# Patient Record
Sex: Male | Born: 1985
Health system: Southern US, Community
[De-identification: ages and names within clinical notes are randomized; demographics above are authoritative.]

---
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
## 2018-03-14 ENCOUNTER — Ambulatory Visit: Payer: Self-pay | Admitting: Nurse Practitioner

## 2018-03-14 ENCOUNTER — Encounter: Payer: Self-pay | Admitting: Nurse Practitioner

## 2018-03-14 VITALS — BP 122/82 | HR 76 | Temp 97.8°F | Ht 68.0 in | Wt 176.8 lb

## 2018-03-14 DIAGNOSIS — Z0000 Encounter for general adult medical examination without abnormal findings: Secondary | ICD-10-CM

## 2018-03-14 NOTE — Patient Instructions (Addendum)
Health Maintenance, Male [phone] or [phone] A healthy lifestyle and preventive care is important for your health and wellness. Ask your health care provider about what schedule of regular examinations is right for you. What should I know about weight and diet? Eat a Healthy Diet  Eat plenty of vegetables, fruits, whole grains, low-fat dairy products, and lean protein.  Do not eat a lot of foods high in solid fats, added sugars, or salt.  Maintain a Healthy Weight Regular exercise can help you achieve or maintain a healthy weight. You should:  Do at least 150 minutes of exercise each week. The exercise should increase your heart rate and make you sweat (moderate-intensity exercise).  Do strength-training exercises at least twice a week.  Watch Your Levels of Cholesterol and Blood Lipids  Have your blood tested for lipids and cholesterol every 5 years starting at 33 years of age. If you are at high risk for heart disease, you should start having your blood tested when you are 33 years old. You may need to have your cholesterol levels checked more often if: ? Your lipid or cholesterol levels are high. ? You are older than 33 years of age. ? You are at high risk for heart disease.  What should I know about cancer screening? Many types of cancers can be detected early and may often be prevented. Lung Cancer  You should be screened every year for lung cancer if: ? You are a current smoker who has smoked for at least 30 years. ? You are a former smoker who has quit within the past 15 years.  Talk to your health care provider about your screening options, when you should start screening, and how often you should be screened.  Colorectal Cancer  Routine colorectal cancer screening usually begins at 33 years of age and should be repeated every 5-10 years until you are 33 years old. You may need to be screened more often if early forms of precancerous  polyps or small growths are found. Your health care provider may recommend screening at an earlier age if you have risk factors for colon cancer.  Your health care provider may recommend using home test kits to check for hidden blood in the stool.  A small camera at the end of a tube can be used to examine your colon (sigmoidoscopy or colonoscopy). This checks for the earliest forms of colorectal cancer.  Prostate and Testicular Cancer  Depending on your age and overall health, your health care provider may do certain tests to screen for prostate and testicular cancer.  Talk to your health care provider about any symptoms or concerns you have about testicular or prostate cancer.  Skin Cancer  Check your skin from head to toe regularly.  Tell your health care provider about any new moles or changes in moles, especially if: ? There is a change in a mole's size, shape, or color. ? You have a mole that is larger than a pencil eraser.  Always use sunscreen. Apply sunscreen liberally and repeat throughout the day.  Protect yourself by wearing long sleeves, pants, a wide-brimmed hat, and sunglasses when outside.  What should I know about heart disease, diabetes, and high blood pressure?  If you are 48-52 years of age, have your blood pressure checked every 3-5 years. If you are 49 years of age or older, have your blood pressure checked every year. You should have your blood pressure measured twice-once when you are at a  hospital or clinic, and once when you are not at a hospital or clinic. Record the average of the two measurements. To check your blood pressure when you are not at a hospital or clinic, you can use: ? An automated blood pressure machine at a pharmacy. ? A home blood pressure monitor.  Talk to your health care provider about your target blood pressure.  If you are between 41-43 years old, ask your health care provider if you should take aspirin to prevent heart  disease.  Have regular diabetes screenings by checking your fasting blood sugar level. ? If you are at a normal weight and have a low risk for diabetes, have this test once every three years after the age of 41. ? If you are overweight and have a high risk for diabetes, consider being tested at a younger age or more often.  A one-time screening for abdominal aortic aneurysm (AAA) by ultrasound is recommended for men aged 30-75 years who are current or former smokers. What should I know about preventing infection? Hepatitis B If you have a higher risk for hepatitis B, you should be screened for this virus. Talk with your health care provider to find out if you are at risk for hepatitis B infection. Hepatitis C Blood testing is recommended for:  Everyone born from [birth year] through 1966.  Anyone with known risk factors for hepatitis C.  Sexually Transmitted Diseases (STDs)  You should be screened each year for STDs including gonorrhea and chlamydia if: ? You are sexually active and are younger than 33 years of age. ? You are older than 33 years of age and your health care provider tells you that you are at risk for this type of infection. ? Your sexual activity has changed since you were last screened and you are at an increased risk for chlamydia or gonorrhea. Ask your health care provider if you are at risk.  Talk with your health care provider about whether you are at high risk of being infected with HIV. Your health care provider may recommend a prescription medicine to help prevent HIV infection.  What else can I do?  Schedule regular health, dental, and eye exams.  Stay current with your vaccines (immunizations).  Do not use any tobacco products, such as cigarettes, chewing tobacco, and e-cigarettes. If you need help quitting, ask your health care provider.  Limit alcohol intake to no more than 2 drinks per day. One drink equals 12 ounces of beer, 5 ounces of wine, or 1 ounces of  hard liquor.  Do not use street drugs.  Do not share needles.  Ask your health care provider for help if you need support or information about quitting drugs.  Tell your health care provider if you often feel depressed.  Tell your health care provider if you have ever been abused or do not feel safe at home. This information is not intended to replace advice given to you by your health care provider. Make sure you discuss any questions you have with your health care provider. Document Released: 03/05/2008 Document Revised: 05/06/2016 Document Reviewed: 06/11/2015 Elsevier Interactive Patient Education  2018 [place] 18-39 Years, Male Preventive care refers to lifestyle choices and visits with your health care provider that can promote health and wellness. What does preventive care include?  A yearly physical exam. This is also called an annual well check.  Dental exams once or twice a year.  Routine eye exams. Ask your health care  provider how often you should have your eyes checked.  Personal lifestyle choices, including: ? Daily care of your teeth and gums. ? Regular physical activity. ? Eating a healthy diet. ? Avoiding tobacco and drug use. ? Limiting alcohol use. ? Practicing safe sex. What happens during an annual well check? The services and screenings done by your health care provider during your annual well check will depend on your age, overall health, lifestyle risk factors, and family history of disease. Counseling Your health care provider may ask you questions about your:  Alcohol use.  Tobacco use.  Drug use.  Emotional well-being.  Home and relationship well-being.  Sexual activity.  Eating habits.  Work and work Statistician.  Screening You may have the following tests or measurements:  Height, weight, and BMI.  Blood pressure.  Lipid and cholesterol levels. These may be checked every 5 years starting at age  42.  Diabetes screening. This is done by checking your blood sugar (glucose) after you have not eaten for a while (fasting).  Skin check.  Hepatitis C blood test.  Hepatitis B blood test.  Sexually transmitted disease (STD) testing.  Discuss your test results, treatment options, and if necessary, the need for more tests with your health care provider. Vaccines Your health care provider may recommend certain vaccines, such as:  Influenza vaccine. This is recommended every year.  Tetanus, diphtheria, and acellular pertussis (Tdap, Td) vaccine. You may need a Td booster every 10 years.  Varicella vaccine. You may need this if you have not been vaccinated.  HPV vaccine. If you are 67 or younger, you may need three doses over 6 months.  Measles, mumps, and rubella (MMR) vaccine. You may need at least one dose of MMR.You may also need a second dose.  Pneumococcal 13-valent conjugate (PCV13) vaccine. You may need this if you have certain conditions and have not been vaccinated.  Pneumococcal polysaccharide (PPSV23) vaccine. You may need one or two doses if you smoke cigarettes or if you have certain conditions.  Meningococcal vaccine. One dose is recommended if you are age 88-21 years and a first-year college student living in a residence hall, or if you have one of several medical conditions. You may also need additional booster doses.  Hepatitis A vaccine. You may need this if you have certain conditions or if you travel or work in places where you may be exposed to hepatitis A.  Hepatitis B vaccine. You may need this if you have certain conditions or if you travel or work in places where you may be exposed to hepatitis B.  Haemophilus influenzae type b (Hib) vaccine. You may need this if you have certain risk factors.  Talk to your health care provider about which screenings and vaccines you need and how often you need them. This information is not intended to replace advice given  to you by your health care provider. Make sure you discuss any questions you have with your health care provider. Document Released: 11/03/2001 Document Revised: 05/27/2016 Document Reviewed: 07/09/2015 Elsevier Interactive Patient Education  2018 [employer].  Preventing Hypertension Hypertension, commonly called high blood pressure, is when the force of blood pumping through the arteries is too strong. Arteries are blood vessels that carry blood from the heart throughout the body. Over time, hypertension can damage the arteries and decrease blood flow to important parts of the body, including the brain, heart, and kidneys. Often, hypertension does not cause symptoms until blood pressure is very high. For this  reason, it is important to have your blood pressure checked on a regular basis. Hypertension can often be prevented with diet and lifestyle changes. If you already have hypertension, you can control it with diet and lifestyle changes, as well as medicine. What nutrition changes can be made? Maintain a healthy diet. This includes:  Eating less salt (sodium). Ask your health care provider how much sodium is safe for you to have. The general recommendation is to consume less than 1 tsp (2,300 mg) of sodium a day. ? Do not add salt to your food. ? Choose low-sodium options when grocery shopping and eating out.  Limiting fats in your diet. You can do this by eating low-fat or fat-free dairy products and by eating less red meat.  Eating more fruits, vegetables, and whole grains. Make a goal to eat: ? 1-2 cups of fresh fruits and vegetables each day. ? 3-4 servings of whole grains each day.  Avoiding foods and beverages that have added sugars.  Eating fish that contain healthy fats (omega-3 fatty acids), such as mackerel or salmon.  If you need help putting together a healthy eating plan, try the DASH diet. This diet is high in fruits, vegetables, and whole grains. It is low in sodium, red  meat, and added sugars. DASH stands for Dietary Approaches to Stop Hypertension. What lifestyle changes can be made?  Lose weight if you are overweight. Losing just 3?5% of your body weight can help prevent or control hypertension. ? For example, if your present weight is 200 lb (91 kg), a loss of 3-5% of your weight means losing 6-10 lb (2.7-4.5 kg). ? Ask your health care provider to help you with a diet and exercise plan to safely lose weight.  Get enough exercise. Do at least 150 minutes of moderate-intensity exercise each week. ? You could do this in short exercise sessions several times a day, or you could do longer exercise sessions a few times a week. For example, you could take a brisk 10-minute walk or bike ride, 3 times a day, for 5 days a week.  Find ways to reduce stress, such as exercising, meditating, listening to music, or taking a yoga class. If you need help reducing stress, ask your health care provider.  Do not smoke. This includes e-cigarettes. Chemicals in tobacco and nicotine products raise your blood pressure each time you smoke. If you need help quitting, ask your health care provider.  Avoid alcohol. If you drink alcohol, limit alcohol intake to no more than 1 drink a day for nonpregnant women and 2 drinks a day for men. One drink equals 12 oz of beer, 5 oz of wine, or 1 oz of hard liquor. Why are these changes important? Diet and lifestyle changes can help you prevent hypertension, and they may make you feel better overall and improve your quality of life. If you have hypertension, making these changes will help you control it and help prevent major complications, such as:  Hardening and narrowing of arteries that supply blood to: ? Your heart. This can cause a heart attack. ? Your brain. This can cause a stroke. ? Your kidneys. This can cause kidney failure.  Stress on your heart muscle, which can cause heart failure.  What can I do to lower my risk?  Work with  your health care provider to make a hypertension prevention plan that works for you. Follow your plan and keep all follow-up visits as told by your health care provider.  Learn how to check your blood pressure at home. Make sure that you know your personal target blood pressure, as told by your health care provider. How is this treated? In addition to diet and lifestyle changes, your health care provider may recommend medicines to help lower your blood pressure. You may need to try a few different medicines to find what works best for you. You also may need to take more than one medicine. Take over-the-counter and prescription medicines only as told by your health care provider. Where to find support: Your health care provider can help you prevent hypertension and help you keep your blood pressure at a healthy level. Your local hospital or your community may also provide support services and prevention programs. The American Heart Association offers an online support network at: CheapBootlegs.com.cy Where to find more information: Learn more about hypertension from:  National Heart, Lung, and Blood Institute: ElectronicHangman.is  Centers for Disease Control and Prevention: https://ingram.com/  American Academy of Family Physicians: http://familydoctor.org/familydoctor/en/diseases-conditions/high-blood-pressure.printerview.all.html  Learn more about the DASH diet from:  [place], Lung, and [place]: https://www.reyes.com/  Contact a health care provider if:  You think you are having a reaction to medicines you have taken.  You have recurrent headaches or feel dizzy.  You have swelling in your ankles.  You have trouble with your vision. Summary  Hypertension often does not cause any symptoms until blood pressure is very high. It is important to get your blood pressure checked  regularly.  Diet and lifestyle changes are the most important steps in preventing hypertension.  By keeping your blood pressure in a healthy range, you can prevent complications like heart attack, heart failure, stroke, and kidney failure.  Work with your health care provider to make a hypertension prevention plan that works for you. This information is not intended to replace advice given to you by your health care provider. Make sure you discuss any questions you have with your health care provider. Document Released: 09/22/2015 Document Revised: 05/18/2016 Document Reviewed: 05/18/2016 Elsevier Interactive Patient Education  2018 [place] Need to Know About Cancer Prevention Although there is no guaranteed method for preventing all cancers, there are many steps that you can take to lower your risk of developing the disease. Making healthy food choices, maintaining a healthy lifestyle, getting regular screenings, and knowing your family's cancer history are all ways that can help to reduce your risk. What nutrition changes can be made? Maintaining a healthy, plant-based diet is one of the easiest ways to lower your cancer risk.  Try to eat more than 2 cups of fruits and vegetables every day.  Eat whole-grain foods instead of refined or processed grains.  Cut down on the amount of red meat and processed meat that you eat. Also limit your intake of charred and smoked meat.  Eat portions that help you stay at a healthy weight.  Limit alcohol intake to no more than 1 drink a day for nonpregnant women and 2 drinks a day for men. One drink equals 12 oz of beer, 5 oz of wine, or 1 oz of hard liquor.  What lifestyle changes can be made?  Do not use any products that contain nicotine or tobacco, such as cigarettes and e-cigarettes. If you need help quitting, ask your health care provider.  Get regular exercise. Adults should aim for 150 minutes of moderate-intensity exercise  (walking, biking, yoga) or 75 minutes of vigorous exercise (running, circuit training, swimming) every week. Exercise should  be spread throughout the week, if possible.  Stay at a healthy weight. Talk with your health care provider about what your weight should be.  Reduce activities that involve a lack of physical activity (are sedentary) such as watching TV.  Stay safe in the sun by applying sunscreen and covering up with hats, clothing, and sunglasses.  Do not use tanning beds or sunlamps.  Avoid exposure to harmful substances such as asbestos, silica, solvents, or radon. Wear a protective mask if you must work near harmful substances. Have your home checked for radon, and hire a professional to lower the radon level if needed.  Get vaccines to help prevent conditions that can eventually lead to cancer, such as hepatitis and HPV (human papillomavirus). Ask your health care provider about which vaccines you should get. Why are these changes important?  Tobacco use is the leading cause of cancer and death from cancer.  Cancer cases in the [place] are linked to higher body weight and obesity, lack of physical activity, and an unhealthy diet.  Cutting your exposure to ultraviolet (UV) radiation and avoiding sunburns lowers your chance of developing skin cancer or melanoma.  HPV is associated with several types of cancer, such as penile, anal, cervical, vulvar, and throat cancer. The HPV vaccine can help to reduce the spread of this virus and help to lower the risk of developing cancer. What can happen if changes are not made? If you do not maintain a healthy diet and lifestyle, reduce sun exposure, or quit tobacco use, you may raise your risk of developing certain types of cancer.  Tobacco use has been linked to cancers of the lung, mouth, esophagus, throat, bladder, kidney, liver, stomach, colon and rectum, and cervix.  Obesity has been linked to an increased risk of developing  cancers of the breast, colon and rectum, esophagus, kidney, pancreas, and gallbladder.  Exposure to UV radiation can cause skin damage that can lead to skin cancer and melanoma.  What can I do to lower my risk? Along with having a healthy diet and lifestyle, you should talk with your health care provider about recommendations for cancer screening.  Pap and HPV testing help to reduce a woman's risk for developing cervical cancer.  Mammograms help to detect signs of breast cancer and have been shown to reduce death from breast cancer in women over age 9.  Colorectal cancer screenings-including colonoscopy, sigmoidoscopy, and stool tests-can help to detect early signs of cancer.  Lung cancer screening with CT scanning has been shown to reduce cancer deaths in heavy smokers over age 51.  Skin exams are sometimes recommended for people who are at a high risk for developing skin cancer. Talk with your health care provider or dermatologist if you notice any new skin changes, moles, or changes to existing moles.  Also talk with your health care provider about any history of cancer in your family. Depending on your family history of cancer, your health care provider may recommend genetic testing to determine whether you may be at higher risk for developing certain types of cancer. Results from these tests can help in making decisions about future medical care and steps for prevention. Where to find more information:  [place]: www.cancer.gov  Cancer Trends Progress Report: www.progressreport.cancer.gov  American Cancer Society: www.cancer.org Contact a health care provider if:  You would like to discuss healthy ways to improve your diet and lifestyle.  You would like to learn more about quitting smoking or tobacco use.  You would like to discuss your family history of cancer and recommendations for cancer screening. Summary  You can take steps to reduce your risk of  developing cancer.  Maintaining a healthy, plant-based diet is one of the easiest ways to lower your cancer risk.  Lifestyle changes can help to reduce your cancer risk. These include staying away from tobacco, reducing sun exposure, and getting regular exercise.  Follow recommendations from your health care provider about screening tests for cancer of the cervix, breast, colon and rectum, lung, and skin.  Talk with your health care provider about any history of cancer in your family. This information is not intended to replace advice given to you by your health care provider. Make sure you discuss any questions you have with your health care provider. Document Released: 06/19/2016 Document Revised: 06/19/2016 Document Reviewed: 06/19/2016 Elsevier Interactive Patient Education  [employer].

## 2018-03-14 NOTE — Progress Notes (Signed)
error 

## 2018-03-14 NOTE — Progress Notes (Signed)
Subjective:  Billy James is a 32 y.o. male who presents for basic physical exam. 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 [facility] to keep insurance premiums low.  Patient denies any current health related concerns today.  The patient denies any past medical history of heart disease, lung disease, kidney disease, liver disease, DM, seizures, HTN or asthma.  The patient does not take any medications and has not medication allergies.  The patient's immunizations are up to date.    The patient has a surgical history of ureteral pelvic obstruction and testicular torsion at age 89.    The patient is married and has one daughter.  The patient's daughter is healthy.  The patient has a family history of colon cancer (in remission) and borderline diabetes on his mother's side.  His mother is living and is 248 years old.  The patient's father has a history of oropharnyx cancer (in remission).  The patient has a brother, who is healthy.  The patient denies the use of recreational drugs, does not smoke and does not drink.  Social History   Tobacco Use  . Smoking status: Never Smoker  . Smokeless tobacco: Never Used  Substance Use Topics  . Alcohol use: Not on file  . Drug use: Not on file    No Known Allergies  No current outpatient medications on file.   No current facility-administered medications for this visit.     Review of Systems  Constitutional: Negative.   HENT: Negative.   Eyes: Negative.   Respiratory: Negative.   Cardiovascular: Negative.   Gastrointestinal: Negative.   Genitourinary: Negative.   Musculoskeletal: Negative.   Skin: Negative.   Neurological: Negative.   Endo/Heme/Allergies: Negative.   Psychiatric/Behavioral: Negative.      Objective:  BP 122/82   Pulse 76   Temp 97.8 F (36.6 C)   Ht 5\' 8"  (1.727 m)   Wt 176 lb 12.8 oz (80.2 kg)   SpO2 98%   BMI 26.88 kg/m   General Appearance:  Alert, cooperative,  no distress, appears stated age  Head:  Normocephalic, without obvious abnormality, atraumatic  Eyes:  PERRL, conjunctiva/corneas clear, EOM's intact, fundi benign, both eyes  Ears:  Normal TM's and external ear canals, both ears  Nose: Nares normal, septum midline, mucosa normal, no drainage or sinus tenderness  Throat: Lips, mucosa, and tongue normal; teeth and gums normal  Neck: Supple, symmetrical, trachea midline, no adenopathy, thyroid: not enlarged, symmetric, no tenderness/mass/nodules, no carotid bruit or JVD  Back:   Symmetric, no curvature, ROM normal, no CVA tenderness  Lungs:   Clear to auscultation bilaterally, respirations unlabored  Chest Wall:  No tenderness or deformity  Heart:  Regular rate and rhythm, S1, S2 normal, no murmur, rub or gallop  Abdomen:   Soft, non-tender, bowel sounds active all four quadrants,  no masses, no organomegaly  Genitalia:  Deferred  Rectal:  Deferred  Extremities: Extremities normal, atraumatic, no cyanosis or edema  Pulses: 2+ and symmetric  Skin: Skin color, texture, turgor normal, no rashes or lesions  Lymph nodes: Cervical, supraclavicular nodes normal  Neurologic: Normal     Assessment:  basic physical exam    Plan:  Patient education provided.  The patient does not have a PCP at this time.  The patient was provided the number for the patient engagement center.  The patient was instructed to follow up with his PCP for labwork or further diagnostic screening test.  The patient was provided information on health maintenance, health prevention for his age group, preventing hypertension and information about cancer prevention.  The patient verbalizes understanding and has no questions at time of discharge. Follow up as needed.

## 2019-03-16 ENCOUNTER — Encounter: Payer: Self-pay | Admitting: Internal Medicine

## 2019-03-16 ENCOUNTER — Other Ambulatory Visit: Payer: Self-pay

## 2019-03-16 ENCOUNTER — Ambulatory Visit (INDEPENDENT_AMBULATORY_CARE_PROVIDER_SITE_OTHER): Payer: No Typology Code available for payment source | Admitting: Internal Medicine

## 2019-03-16 VITALS — BP 118/81 | HR 82 | Temp 98.0°F | Ht 69.0 in | Wt 168.3 lb

## 2019-03-16 DIAGNOSIS — R319 Hematuria, unspecified: Secondary | ICD-10-CM | POA: Diagnosis not present

## 2019-03-16 DIAGNOSIS — Z0000 Encounter for general adult medical examination without abnormal findings: Secondary | ICD-10-CM | POA: Insufficient documentation

## 2019-03-16 LAB — POCT URINALYSIS DIPSTICK
Bilirubin, UA: NEGATIVE
Glucose, UA: NEGATIVE
Ketones, UA: NEGATIVE
Leukocytes, UA: NEGATIVE
Nitrite, UA: NEGATIVE
Protein, UA: NEGATIVE
Spec Grav, UA: 1.015 (ref 1.010–1.025)
Urobilinogen, UA: 0.2 E.U./dL
pH, UA: 7 (ref 5.0–8.0)

## 2019-03-16 NOTE — Progress Notes (Signed)
CC: Establish Care, Hematuria   HPI:  Billy James is a 33 y.o. M with PMHx listed below presenting to Establish Care, and for Hematuria. Please see the A&P for the status of the patient's chronic medical problems.  Past Medical History:  Diagnosis Date  . Testicular torsion 1994   Past Surgical History:  Procedure Laterality Date  . Ureteropelvic junction obstruction repair Left 1994   Reportedly excision and reanastamosis   Social History   Socioeconomic History  . Marital status: Married    Spouse name: Not on file  . Number of children: Not on file  . Years of education: Not on file  . Highest education level: Not on file  Occupational History  . Not on file  Social Needs  . Financial resource strain: Not on file  . Food insecurity    Worry: Not on file    Inability: Not on file  . Transportation needs    Medical: Not on file    Non-medical: Not on file  Tobacco Use  . Smoking status: Never Smoker  . Smokeless tobacco: Never Used  Substance and Sexual Activity  . Alcohol use: Yes    Comment: OCASSIONAL  . Drug use: Never  . Sexual activity: Yes    Birth control/protection: Implant  Lifestyle  . Physical activity    Days per week: Not on file    Minutes per session: Not on file  . Stress: Not on file  Relationships  . Social Musicianconnections    Talks on phone: Not on file    Gets together: Not on file    Attends religious service: Not on file    Active member of club or organization: Not on file    Attends meetings of clubs or organizations: Not on file    Relationship status: Not on file  . Intimate partner violence    Fear of current or ex partner: Not on file    Emotionally abused: Not on file    Physically abused: Not on file    Forced sexual activity: Not on file  Other Topics Concern  . Not on file  Social History Narrative  . Not on file   Family History  Problem Relation Age of Onset  . Cancer Mother   . Cancer Father   .  Hypertension Father     Review of Systems: Performed and all others negative.  Physical Exam:  Vitals:   03/16/19 0838  BP: 118/81  Pulse: 82  Temp: 98 F (36.7 C)  TempSrc: Oral  SpO2: 99%  Weight: 168 lb 4.8 oz (76.3 kg)  Height: 5\' 9"  (1.753 m)   Physical Exam Constitutional:      General: He is not in acute distress.    Appearance: Normal appearance.  Cardiovascular:     Rate and Rhythm: Normal rate and regular rhythm.     Pulses: Normal pulses.     Heart sounds: Normal heart sounds.  Pulmonary:     Effort: Pulmonary effort is normal. No respiratory distress.     Breath sounds: Normal breath sounds.  Abdominal:     General: Bowel sounds are normal. There is no distension.     Palpations: Abdomen is soft.     Tenderness: There is no abdominal tenderness.  Musculoskeletal:        General: No swelling or deformity.  Skin:    General: Skin is warm and dry.  Neurological:     General: No focal deficit present.  Mental Status: Mental status is at baseline.    Assessment & Plan:   See Encounters Tab for problem based charting.  Patient discussed with Dr. Daryll Drown

## 2019-03-16 NOTE — Patient Instructions (Signed)
Thank you for allowing [place] to care for you  For your exercise induced hematuria - We are working this up with Urinalysis, CMP, and CBC - You will be contacted with the results and any further workup plans

## 2019-03-16 NOTE — Assessment & Plan Note (Addendum)
Patient reports about 2 weeks of darkening urine occurring after his afternoon run. The color has become progressively darker and after his last run a few days ago appeared bright red. He states the urine is clearing by morning and his clear by mid-day or afternoon before his next run. It does not occur if he does not run. He is running about 3 miles and has been doing so for the past 2-3 months. He denies urinary frequency, hesitancy, dysuria, abdominal pain, or any other associated symptoms. He has not been eating any beets. He has tried drinking more water to help with possible rhabdo (though he has no pain) and to dilute and blood.  He does have a history of Ureteropelvic junction obstruction with surgical repair (believed to have been resection and reanastomosis) when he was about 33 years old. Which could make him more prone to bleeding/irritation at the site of anastomosis.  Presentation most consistent with exercised-induced hematuria, which is considered a benign condition. Especially due to the occurance after exercise and resolution with rest. Urine dip positive for small Hgb, but otherwise WNL. Will check renal function to evaluate for kidney injury, and full U/A with Micro to evaluate for Hgb vs Myoglobin and evalute RBC morphology, (also will check hemoglobin at his is establishing care).  - Urinalysis with Micro - CMP - CBC  ADDENDUM:   Patient was contacted by phone and updated with lab results. CBC and CMP were WNL. Urinalysis showed positive for blood (heme), but Microscopy analysis was negative for RBCs.   We discussed possibly etiologies to explain his symptoms in the setting of these results: Exercise Induced Hematuria vs March Hemoglobinuria vs Natraumatic Exertional Rhabdomyolysis. Given the absence of red blood cells on microscopy he does not quite fit into the category of Exercise Induced Hematuria; however, his sample was taken after his urine had largely cleared. He could have  Natraumatic Exertional Rhabdomyolysis as this would be consistent with the results of his lab work and he had only been running again for a number of months and the weather is getting hotter, but he did not seen improvement after improved hydration. March Hemoglobinuria is also consistent with his lab findings. He states he is using new shoes, but has been running mainly on concrete.  He will try running on softer surfaces and maybe changing shoes to address possible March hemoglobinuria. He will also work to remain well hydrated to address possibility of mild rhabdomyolysis. He states he is not sure if these entities explain his signs due to just how red his urine had become. We discussed returning for possible repeat U/A with Microscopy if symptoms fail to improve or worsen; but to do so after a run this time to increase the likelihood of capturing RBCs if present.  He was informed that even if it is exercised induced hematuria, this is thought to be a benign entity that does not require further workup provided symptoms remain associated with exercise and patient is less than 91 years old. Patient understands and agrees to plan. 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

## 2019-03-17 ENCOUNTER — Telehealth: Payer: Self-pay | Admitting: Internal Medicine

## 2019-03-17 LAB — URINALYSIS, COMPLETE
Bilirubin, UA: NEGATIVE
Glucose, UA: NEGATIVE
Ketones, UA: NEGATIVE
Leukocytes,UA: NEGATIVE
Nitrite, UA: NEGATIVE
Protein,UA: NEGATIVE
Specific Gravity, UA: 1.017 (ref 1.005–1.030)
Urobilinogen, Ur: 0.2 mg/dL (ref 0.2–1.0)
pH, UA: 7 (ref 5.0–7.5)

## 2019-03-17 LAB — CBC WITH DIFFERENTIAL/PLATELET
Basophils Absolute: 0 10*3/uL (ref 0.0–0.2)
Basos: 1 %
EOS (ABSOLUTE): 0.2 10*3/uL (ref 0.0–0.4)
Eos: 5 %
Hematocrit: 44.8 % (ref 37.5–51.0)
Hemoglobin: 15.2 g/dL (ref 13.0–17.7)
Immature Grans (Abs): 0 10*3/uL (ref 0.0–0.1)
Immature Granulocytes: 0 %
Lymphocytes Absolute: 1 10*3/uL (ref 0.7–3.1)
Lymphs: 28 %
MCH: 30.3 pg (ref 26.6–33.0)
MCHC: 33.9 g/dL (ref 31.5–35.7)
MCV: 89 fL (ref 79–97)
Monocytes Absolute: 0.3 10*3/uL (ref 0.1–0.9)
Monocytes: 9 %
Neutrophils Absolute: 2 10*3/uL (ref 1.4–7.0)
Neutrophils: 57 %
Platelets: 193 10*3/uL (ref 150–450)
RBC: 5.02 x10E6/uL (ref 4.14–5.80)
RDW: 12.4 % (ref 11.6–15.4)
WBC: 3.5 10*3/uL (ref 3.4–10.8)

## 2019-03-17 LAB — CMP14 + ANION GAP
ALT: 26 IU/L (ref 0–44)
AST: 40 IU/L (ref 0–40)
Albumin/Globulin Ratio: 2.5 — ABNORMAL HIGH (ref 1.2–2.2)
Albumin: 5 g/dL (ref 4.0–5.0)
Alkaline Phosphatase: 60 IU/L (ref 39–117)
Anion Gap: 15 mmol/L (ref 10.0–18.0)
BUN/Creatinine Ratio: 11 (ref 9–20)
BUN: 12 mg/dL (ref 6–20)
Bilirubin Total: 0.8 mg/dL (ref 0.0–1.2)
CO2: 24 mmol/L (ref 20–29)
Calcium: 10 mg/dL (ref 8.7–10.2)
Chloride: 99 mmol/L (ref 96–106)
Creatinine, Ser: 1.11 mg/dL (ref 0.76–1.27)
GFR calc Af Amer: 100 mL/min/{1.73_m2} (ref >59)
GFR calc non Af Amer: 86 mL/min/{1.73_m2} (ref >59)
Globulin, Total: 2 g/dL (ref 1.5–4.5)
Glucose: 87 mg/dL (ref 65–99)
Potassium: 4.6 mmol/L (ref 3.5–5.2)
Sodium: 138 mmol/L (ref 134–144)
Total Protein: 7 g/dL (ref 6.0–8.5)

## 2019-03-17 LAB — MICROSCOPIC EXAMINATION
Bacteria, UA: NONE SEEN
Casts: NONE SEEN /lpf

## 2019-03-17 NOTE — Telephone Encounter (Signed)
Patient was contacted by phone and updated with lab results. CBC and CMP were WNL. Urinalysis showed positive for blood (heme), but Microscopy analysis was negative for RBCs.   We discussed possibly etiologies to explain his symptoms in the setting of these results: Exercise Induced Hematuria vs March Hemoglobinuria vs Natraumatic Exertional Rhabdomyolysis. Given the absence of red blood cells on microscopy he does not quite fit into the category of Exercise Induced Hematuria; however, his sample was taken after his urine had largely cleared. He could have Natraumatic Exertional Rhabdomyolysis as this would be consistent with the results of his lab work and he had only been running again for a number of months and the weather is getting hotter, but he did not seen improvement after improved hydration. March Hemoglobinuria is also consistent with his lab findings. He states he is using new shoes, but has been running mainly on concrete.  He will try running on softer surfaces and maybe changing shoes to address possible March hemoglobinuria. He will also work to remain well hydrated to address possibility of mild rhabdomyolysis. He states he is not sure if these entities explain his signs due to just how red his urine had become. We discussed returning for possible repeat U/A with Microscopy if symptoms fail to improve or worsen; but to do so after a run this time to increase the likelihood of capturing RBCs if present.  He was informed that even if it is exercised induced hematuria, this is thought to be a benign entity that does not require further workup provided symptoms remain associated with exercise and patient is less than 17 years old. Patient understands and agrees to plan.

## 2019-03-26 NOTE — Progress Notes (Signed)
Internal Medicine Clinic Attending  Case discussed with Dr. Melvin  at the time of the visit.  We reviewed the resident's history and exam and pertinent patient test results.  I agree with the assessment, diagnosis, and plan of care documented in the resident's note.  

## 2020-11-21 ENCOUNTER — Encounter: Payer: Self-pay | Admitting: Internal Medicine

## 2020-11-21 ENCOUNTER — Ambulatory Visit (INDEPENDENT_AMBULATORY_CARE_PROVIDER_SITE_OTHER): Payer: No Typology Code available for payment source | Admitting: Internal Medicine

## 2020-11-21 VITALS — BP 124/81 | HR 80 | Wt 173.5 lb

## 2020-11-21 DIAGNOSIS — Z1159 Encounter for screening for other viral diseases: Secondary | ICD-10-CM | POA: Diagnosis not present

## 2020-11-21 DIAGNOSIS — Z114 Encounter for screening for human immunodeficiency virus [HIV]: Secondary | ICD-10-CM

## 2020-11-21 DIAGNOSIS — R319 Hematuria, unspecified: Secondary | ICD-10-CM

## 2020-11-21 NOTE — Progress Notes (Signed)
   CC: hematuria  HPI:  [name] is a 35 y.o. with PMH as below.   Please see A&P for assessment of the patient's acute and chronic medical conditions.   Continuing to have hematuria, symptoms initially started with running, which he started doing two years ago. Prior to this he would weight lift, and hematuria or darkening of urine never occurred. When he does weight lifting now it still does not occur, just with running and will occur regardless of if he has a 1 mile vs. 5 mile run. No family history of similar events. He has now noticed occasionally he will pass blood clots as well. No flank pain, abdominal pain, dysuria, fever, chills, myalgias, easy bruising or other bleeding. He does sometimes have increased urinary frequency at night when he drinks water before bed but not otherwise.   Past Medical History:  Diagnosis Date  . Testicular torsion 1994   Review of Systems:   10 point ROS negative except as noted in HPI  Physical Exam:   Constitution: NAD, appears stated age HENT: [place]/AT Eyes: no icterus or injection  Cardio: regular rate, no LE edema  Respiratory: non-labored breathing, on room air Abdominal: NTTP, soft, non-distended, normal BS, no CVA tenderness MSK: moving all extremities Neuro: normal affect, a&ox3 Skin: c/d/i    Vitals:   11/21/20 1017  BP: 124/81  Pulse: 80  SpO2: 99%  Weight: 173 lb 8 oz (78.7 kg)     Assessment & Plan:   See Encounters Tab for problem based charting.  Patient discussed with Dr. Heide Spark

## 2020-11-21 NOTE — Assessment & Plan Note (Addendum)
Continuing to have hematuria, symptoms initially started with running, which he started doing two years ago. Prior to this he would weight lift, and hematuria or darkening of urine never occurred. When he does weight lifting now it still does not occur, just with running and will occur regardless of if he has a 1 mile vs. 5 mile run. No family history of similar events. He has now noticed occasionally he will pass blood clots as well. No flank pain, abdominal pain, dysuria, fever, chills, myalgias, easy bruising or other bleeding. He does sometimes have increased urinary frequency at night when he drinks water before bed but not otherwise.  Seen previously in clinic and UA showed trace hemoglobinuria, notably this was not after a run. Symptoms were thought to be secondary to exercise induced hematuria vs. March hematuria vs. Nontraumatic exercise induced rhabdomyolysis. CMP and CBC were unremarkable. He ran before today's visit and there is macroscopic hematuria present, no noted clots on exam. He does have a history of left ureteropelvic junction obstruction s/p resection and reanastomosis at about age 35.  Symptoms may be secondary to exercise induced hematuria vs. March hematuria, doubt rhabdo induced with clot formation. With clot formation and prior surgery further work-up is indicated with urological consultation for cystoscopy to rule out possible compression of the left ureter and for further evaluation of the bladder.   - UA, protein/cr ratio  - CBC, CMP, CK - referral placed to urology   Mildly elevated CK in the setting of excessive exercise prior to coming into the clinic. This elevation is not significant enough to be contributing to his hematuria, which showed RBCs in the urine. Discussed with him, and he will be following up with urology next week.

## 2020-11-21 NOTE — Patient Instructions (Signed)
Thank you for allowing [place] to provide your care today. Today we discussed hematuria  I have ordered the following labs for you:  CMP, CBC, CK, HIV, hepatitis C, urinalysis, and protein/creatinine ratio   I will call if any are abnormal.    I have placed a referral to urology. They will call you for an appointment.    Please call the internal medicine center clinic if you have any questions or concerns, we may be able to help and keep you from a long and expensive emergency room wait. Our clinic and after hours phone number is [phone], the best time to call is Monday through Friday 9 am to 4 pm but there is always someone available 24/7 if you have an emergency. If you need medication refills please notify your pharmacy one week in advance and they will send [place] a request.

## 2020-11-22 ENCOUNTER — Encounter: Payer: Self-pay | Admitting: *Deleted

## 2020-11-22 LAB — CMP14 + ANION GAP
ALT: 38 IU/L (ref 0–44)
AST: 56 IU/L — ABNORMAL HIGH (ref 0–40)
Albumin/Globulin Ratio: 1.9 (ref 1.2–2.2)
Albumin: 4.8 g/dL (ref 4.0–5.0)
Alkaline Phosphatase: 74 IU/L (ref 44–121)
Anion Gap: 16 mmol/L (ref 10.0–18.0)
BUN/Creatinine Ratio: 15 (ref 9–20)
BUN: 19 mg/dL (ref 6–20)
Bilirubin Total: 0.6 mg/dL (ref 0.0–1.2)
CO2: 24 mmol/L (ref 20–29)
Calcium: 9.9 mg/dL (ref 8.7–10.2)
Chloride: 99 mmol/L (ref 96–106)
Creatinine, Ser: 1.29 mg/dL — ABNORMAL HIGH (ref 0.76–1.27)
Globulin, Total: 2.5 g/dL (ref 1.5–4.5)
Glucose: 94 mg/dL (ref 65–99)
Potassium: 4.5 mmol/L (ref 3.5–5.2)
Sodium: 139 mmol/L (ref 134–144)
Total Protein: 7.3 g/dL (ref 6.0–8.5)
eGFR: 74 mL/min/{1.73_m2} (ref >59)

## 2020-11-22 LAB — URINALYSIS, ROUTINE W REFLEX MICROSCOPIC
Bilirubin, UA: NEGATIVE
Glucose, UA: NEGATIVE
Ketones, UA: NEGATIVE
Nitrite, UA: NEGATIVE
Specific Gravity, UA: 1.02 (ref 1.005–1.030)
Urobilinogen, Ur: 0.2 mg/dL (ref 0.2–1.0)
pH, UA: 6 (ref 5.0–7.5)

## 2020-11-22 LAB — CBC WITH DIFFERENTIAL/PLATELET
Basophils Absolute: 0.1 10*3/uL (ref 0.0–0.2)
Basos: 1 %
EOS (ABSOLUTE): 0.1 10*3/uL (ref 0.0–0.4)
Eos: 1 %
Hematocrit: 46.7 % (ref 37.5–51.0)
Hemoglobin: 15.5 g/dL (ref 13.0–17.7)
Immature Grans (Abs): 0 10*3/uL (ref 0.0–0.1)
Immature Granulocytes: 0 %
Lymphocytes Absolute: 0.9 10*3/uL (ref 0.7–3.1)
Lymphs: 12 %
MCH: 30.1 pg (ref 26.6–33.0)
MCHC: 33.2 g/dL (ref 31.5–35.7)
MCV: 91 fL (ref 79–97)
Monocytes Absolute: 0.7 10*3/uL (ref 0.1–0.9)
Monocytes: 9 %
Neutrophils Absolute: 5.5 10*3/uL (ref 1.4–7.0)
Neutrophils: 77 %
Platelets: 208 10*3/uL (ref 150–450)
RBC: 5.15 x10E6/uL (ref 4.14–5.80)
RDW: 12.4 % (ref 11.6–15.4)
WBC: 7.2 10*3/uL (ref 3.4–10.8)

## 2020-11-22 LAB — HIV ANTIBODY (ROUTINE TESTING W REFLEX): HIV Screen 4th Generation wRfx: NONREACTIVE

## 2020-11-22 LAB — MICROSCOPIC EXAMINATION
Bacteria, UA: NONE SEEN
Casts: NONE SEEN /lpf
Epithelial Cells (non renal): NONE SEEN /hpf (ref 0–10)
RBC, Urine: >30 /hpf — AB (ref 0–2)

## 2020-11-22 LAB — CK: Total CK: 903 U/L (ref 49–439)

## 2020-11-22 LAB — PROTEIN / CREATININE RATIO, URINE
Creatinine, Urine: 142.9 mg/dL
Protein, Ur: 71.7 mg/dL
Protein/Creat Ratio: 502 mg/g creat — ABNORMAL HIGH (ref 0–200)

## 2020-11-22 LAB — HEPATITIS C ANTIBODY: Hep C Virus Ab: <0.1 s/co ratio (ref 0.0–0.9)

## 2020-11-22 NOTE — Progress Notes (Signed)
Internal Medicine Clinic Attending  Case discussed with Dr. Seawell  At the time of the visit.  We reviewed the resident's history and exam and pertinent patient test results.  I agree with the assessment, diagnosis, and plan of care documented in the resident's note.  

## 2021-01-01 ENCOUNTER — Other Ambulatory Visit: Payer: Self-pay | Admitting: Urology

## 2021-01-01 ENCOUNTER — Other Ambulatory Visit (HOSPITAL_COMMUNITY): Payer: Self-pay | Admitting: Urology

## 2021-01-01 DIAGNOSIS — N130 Hydronephrosis with ureteropelvic junction obstruction: Secondary | ICD-10-CM

## 2021-01-15 ENCOUNTER — Other Ambulatory Visit (HOSPITAL_COMMUNITY): Payer: No Typology Code available for payment source

## 2021-01-15 ENCOUNTER — Encounter (HOSPITAL_COMMUNITY): Payer: Self-pay

## 2021-01-16 ENCOUNTER — Ambulatory Visit (HOSPITAL_COMMUNITY)
Admission: RE | Admit: 2021-01-16 | Discharge: 2021-01-16 | Disposition: A | Discharge status: Home / Self Care | Payer: No Typology Code available for payment source | Source: Ambulatory Visit | Attending: Urology | Admitting: Urology

## 2021-01-16 ENCOUNTER — Other Ambulatory Visit: Payer: Self-pay

## 2021-01-16 DIAGNOSIS — N130 Hydronephrosis with ureteropelvic junction obstruction: Secondary | ICD-10-CM | POA: Diagnosis not present

## 2021-01-16 MED ORDER — FUROSEMIDE 10 MG/ML IJ SOLN: 40.0000 mg | Freq: Once | INTRAMUSCULAR | Status: DC

## 2021-01-16 MED ORDER — FUROSEMIDE 10 MG/ML IJ SOLN
INTRAMUSCULAR | Status: AC
  Filled 2021-01-16: qty 4

## 2021-02-03 ENCOUNTER — Other Ambulatory Visit: Payer: Self-pay | Admitting: Urology

## 2021-02-03 DIAGNOSIS — N200 Calculus of kidney: Secondary | ICD-10-CM

## 2021-02-20 NOTE — Progress Notes (Signed)
Patient to arrive at 0600 on 02/24/2021. History and medications reviewed. Pre-procedure instructions given. NPO after MN,except for clear liquids until 0330. Driver secured.

## 2021-02-23 NOTE — H&P (Signed)
H&P  Chief Complaint: Kidney stone  History of Present Illness: 35 yo male w/ 8 mm LLP calculus--initially presented w/ gross hematuria w/ running. SSD 5.5 cm, HU 650.  Past Medical History:  Diagnosis Date  . Testicular torsion 1994    Past Surgical History:  Procedure Laterality Date  . Ureteropelvic junction obstruction repair Left 1994   Reportedly excision and reanastamosis    Home Medications:  Allergies as of 02/23/2021   No Known Allergies     Medication List    Notice   Cannot display discharge medications because the patient has not yet been admitted.     Allergies: No Known Allergies  Family History  Problem Relation Age of Onset  . Cancer Mother   . Cancer Father   . Hypertension Father     Social History:  reports that he has never smoked. He has never used smokeless tobacco. He reports current alcohol use. He reports that he does not use drugs.  ROS: A complete review of systems was performed.  All systems are negative except for pertinent findings as noted.  Physical Exam:  Vital signs in last 24 hours: There were no vitals taken for this visit. Constitutional:  Alert and oriented, No acute distress Cardiovascular: Regular rate  Respiratory: Normal respiratory effort GI: Abdomen is soft, nontender, nondistended, no abdominal masses. No CVAT.  Genitourinary: Normal male phallus, testes are descended bilaterally and non-tender and without masses, scrotum is normal in appearance without lesions or masses, perineum is normal on inspection. Lymphatic: No lymphadenopathy Neurologic: Grossly intact, no focal deficits Psychiatric: Normal mood and affect  Laboratory Data:  No results for input(s): WBC, HGB, HCT, PLT in the last 72 hours.  No results for input(s): NA, K, CL, GLUCOSE, BUN, CALCIUM, CREATININE in the last 72 hours.  Invalid input(s): CO3   No results found for this or any previous visit (from the past 24 hour(s)). No results found for  this or any previous visit (from the past 240 hour(s)).  Renal Function: No results for input(s): CREATININE in the last 168 hours. CrCl cannot be calculated (Patient's most recent lab result is older than the maximum 21 days allowed.).  Radiologic Imaging: No results found.  Impression/Assessment:  8 mm LLP stone  Plan:  ESL

## 2021-02-24 ENCOUNTER — Encounter (HOSPITAL_BASED_OUTPATIENT_CLINIC_OR_DEPARTMENT_OTHER): Payer: Self-pay | Admitting: Urology

## 2021-02-24 ENCOUNTER — Ambulatory Visit (HOSPITAL_COMMUNITY): Payer: No Typology Code available for payment source

## 2021-02-24 ENCOUNTER — Other Ambulatory Visit: Payer: Self-pay

## 2021-02-24 ENCOUNTER — Encounter (HOSPITAL_BASED_OUTPATIENT_CLINIC_OR_DEPARTMENT_OTHER)
Admission: RE | Disposition: A | Discharge status: Home / Self Care | Payer: Self-pay | Source: Home / Self Care | Attending: Urology

## 2021-02-24 ENCOUNTER — Ambulatory Visit (HOSPITAL_BASED_OUTPATIENT_CLINIC_OR_DEPARTMENT_OTHER)
Admission: RE | Admit: 2021-02-24 | Discharge: 2021-02-24 | Disposition: A | Discharge status: Home / Self Care | Payer: No Typology Code available for payment source | Attending: Urology | Admitting: Urology

## 2021-02-24 DIAGNOSIS — N200 Calculus of kidney: Secondary | ICD-10-CM | POA: Insufficient documentation

## 2021-02-24 HISTORY — PX: EXTRACORPOREAL SHOCK WAVE LITHOTRIPSY: SHX1557

## 2021-02-24 SURGERY — LITHOTRIPSY, ESWL
Anesthesia: LOCAL | Laterality: Left

## 2021-02-24 MED ORDER — DIPHENHYDRAMINE HCL 25 MG PO CAPS
ORAL_CAPSULE | ORAL | Status: AC
  Filled 2021-02-24: qty 1

## 2021-02-24 MED ORDER — DIAZEPAM 5 MG PO TABS
ORAL_TABLET | ORAL | Status: AC
  Filled 2021-02-24: qty 2

## 2021-02-24 MED ORDER — CIPROFLOXACIN HCL 500 MG PO TABS
ORAL_TABLET | ORAL | Status: AC
  Filled 2021-02-24: qty 1

## 2021-02-24 MED ORDER — CIPROFLOXACIN HCL 500 MG PO TABS
500.0000 mg | ORAL_TABLET | ORAL | Status: AC
  Administered 2021-02-24: 500 mg via ORAL

## 2021-02-24 MED ORDER — DIPHENHYDRAMINE HCL 25 MG PO CAPS
25.0000 mg | ORAL_CAPSULE | ORAL | Status: AC
  Administered 2021-02-24: 25 mg via ORAL

## 2021-02-24 MED ORDER — OXYCODONE HCL 5 MG PO TABS: 5.0000 mg | ORAL_TABLET | Freq: Three times a day (TID) | ORAL | 0 refills | Status: AC | PRN

## 2021-02-24 MED ORDER — SODIUM CHLORIDE 0.9 % IV SOLN: INTRAVENOUS | Status: DC

## 2021-02-24 MED ORDER — DIAZEPAM 5 MG PO TABS
10.0000 mg | ORAL_TABLET | ORAL | Status: AC
  Administered 2021-02-24: 10 mg via ORAL

## 2021-02-24 NOTE — Op Note (Signed)
See [facility] OP note scanned into chart. 

## 2021-02-24 NOTE — Discharge Instructions (Signed)
See [facility] discharge instructions in chart.  

## 2021-02-24 NOTE — Interval H&P Note (Signed)
History and Physical Interval Note:  02/24/2021 8:38 AM  Billy James  has presented today for surgery, with the diagnosis of LEFT RENAL STONE.  The various methods of treatment have been discussed with the patient and family. After consideration of risks, benefits and other options for treatment, the patient has consented to  Procedure(s): LEFT EXTRACORPOREAL SHOCK WAVE LITHOTRIPSY (ESWL) (Left) as a surgical intervention.  The patient's history has been reviewed, patient examined, no change in status, stable for surgery.  I have reviewed the patient's chart and labs.  Questions were answered to the patient's satisfaction.     Bertram Millard Jayleen Afonso

## 2021-02-25 ENCOUNTER — Encounter (HOSPITAL_BASED_OUTPATIENT_CLINIC_OR_DEPARTMENT_OTHER): Payer: Self-pay | Admitting: Urology

## 2021-03-25 ENCOUNTER — Encounter: Payer: Self-pay | Admitting: *Deleted

## 2022-04-28 IMAGING — NM NM RENAL IMAGING FLOW W/ PHARM
2 series · 12 of 12 positions shown · non-contrast
Comparison: CT 12/19/2020

CLINICAL DATA: Hematuria. Remote UPJ obstruction on the LEFT.
Hydronephrosis on recent CT exam

EXAM:
NUCLEAR MEDICINE RENAL SCAN WITH DIURETIC ADMINISTRATION
TECHNIQUE: Radionuclide angiographic and sequential renal images were obtained
after intravenous injection of radiopharmaceutical. Imaging was
continued during slow intravenous injection of Lasix approximately
15 minutes after the start of the examination.
RADIOPHARMACEUTICALS:  5.5 mCi 5echnetium-UUm MAG3 IV

[Series 1: renal scan · 4.14mm/px · 6 of 60 frames shown (1 of 2)]
[frame 6/60]
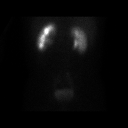
[frame 16/60]
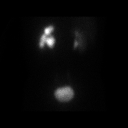
[frame 26/60]
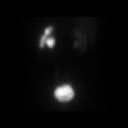
[frame 36/60]
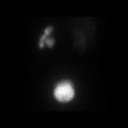
[frame 46/60]
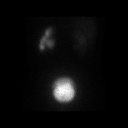
[frame 56/60]
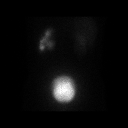

[Series 1: renal scan · 4.14mm/px · 6 of 40 frames shown (2 of 2)]
[frame 4/40  full-range]
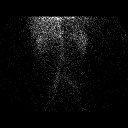
[frame 10/40  full-range]
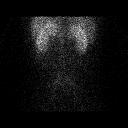
[frame 17/40  full-range]
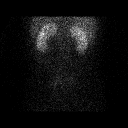
[frame 24/40  full-range]
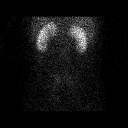
[frame 30/40  full-range]
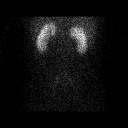
[frame 37/40  full-range]
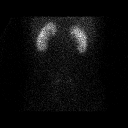

[12 of 12 positions shown; findings below may reference images not displayed]

FINDINGS: Flow:  Prompt symmetric arterial flow to the kidneys.

Left renogram: Prompt uniform cortical renal uptake. Counts are
quickly excreted into the renal pelvis. The renal pelvis is dilated.
Administration of Lasix augments clearance of radiotracer from the
renal pelvis. Mild postvoid residual.

Right renogram: Uniform uptake of counts in the renal cortex. Counts
are promptly excreted into the collecting system and cleared prior
to administration of Lasix. Lasix augment clearance. No postvoid
residual.

Differential:

Left kidney = 51 %

Right kidney = 49 %

T1/2 post Lasix :

Left kidney = 12 min

Right kidney = 12 min
IMPRESSION: 1. Nonobstructive hydronephrosis of the LEFT kidney.
2. Normal RIGHT kidney.
3. Small postvoid residual within the LEFT renal pelvis.

## 2023-10-28 DIAGNOSIS — Z1211 Encounter for screening for malignant neoplasm of colon: Secondary | ICD-10-CM | POA: Diagnosis not present

## 2023-10-28 DIAGNOSIS — R945 Abnormal results of liver function studies: Secondary | ICD-10-CM | POA: Diagnosis not present

## 2023-10-28 DIAGNOSIS — Z800 Family history of malignant neoplasm of digestive organs: Secondary | ICD-10-CM | POA: Diagnosis not present

## 2023-11-19 ENCOUNTER — Other Ambulatory Visit (HOSPITAL_BASED_OUTPATIENT_CLINIC_OR_DEPARTMENT_OTHER): Payer: Self-pay

## 2023-11-19 MED ORDER — PEG-3350/ELECTROLYTES 236 G PO SOLR
ORAL | 0 refills | Status: AC
  Filled 2023-11-19: qty 4000, 1d supply, fill #0

## 2023-11-25 DIAGNOSIS — Z800 Family history of malignant neoplasm of digestive organs: Secondary | ICD-10-CM | POA: Diagnosis not present

## 2023-11-25 DIAGNOSIS — Z1211 Encounter for screening for malignant neoplasm of colon: Secondary | ICD-10-CM | POA: Diagnosis not present
